# Patient Record
Sex: Female | Born: 1965 | Race: White | Hispanic: No | Marital: Married | Smoking: Never smoker
Health system: Southern US, Community
[De-identification: ages and names within clinical notes are randomized; demographics above are authoritative.]

## PROBLEM LIST (undated history)

## (undated) DIAGNOSIS — L0100 Impetigo, unspecified: Secondary | ICD-10-CM

## (undated) DIAGNOSIS — M25539 Pain in unspecified wrist: Secondary | ICD-10-CM

## (undated) DIAGNOSIS — Z9189 Other specified personal risk factors, not elsewhere classified: Secondary | ICD-10-CM

## (undated) DIAGNOSIS — Z87448 Personal history of other diseases of urinary system: Secondary | ICD-10-CM

## (undated) DIAGNOSIS — J309 Allergic rhinitis, unspecified: Secondary | ICD-10-CM

## (undated) DIAGNOSIS — R011 Cardiac murmur, unspecified: Secondary | ICD-10-CM

## (undated) DIAGNOSIS — R197 Diarrhea, unspecified: Secondary | ICD-10-CM

## (undated) HISTORY — DX: Impetigo, unspecified: L01.00

## (undated) HISTORY — DX: Diarrhea, unspecified: R19.7

## (undated) HISTORY — DX: Other specified personal risk factors, not elsewhere classified: Z91.89

## (undated) HISTORY — DX: Allergic rhinitis, unspecified: J30.9

## (undated) HISTORY — PX: OTHER SURGICAL HISTORY: SHX169

## (undated) HISTORY — DX: Personal history of other diseases of urinary system: Z87.448

## (undated) HISTORY — DX: Pain in unspecified wrist: M25.539

## (undated) HISTORY — DX: Cardiac murmur, unspecified: R01.1

---
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
## 2009-03-30 ENCOUNTER — Encounter (INDEPENDENT_AMBULATORY_CARE_PROVIDER_SITE_OTHER): Payer: Self-pay | Admitting: *Deleted

## 2009-03-30 ENCOUNTER — Encounter: Payer: Self-pay | Admitting: Internal Medicine

## 2009-03-30 ENCOUNTER — Ambulatory Visit: Payer: Self-pay | Admitting: Internal Medicine

## 2009-03-30 DIAGNOSIS — J309 Allergic rhinitis, unspecified: Secondary | ICD-10-CM | POA: Insufficient documentation

## 2009-03-30 DIAGNOSIS — Z9189 Other specified personal risk factors, not elsewhere classified: Secondary | ICD-10-CM | POA: Insufficient documentation

## 2009-03-30 DIAGNOSIS — R011 Cardiac murmur, unspecified: Secondary | ICD-10-CM

## 2009-03-30 DIAGNOSIS — Z87448 Personal history of other diseases of urinary system: Secondary | ICD-10-CM | POA: Insufficient documentation

## 2009-03-30 HISTORY — DX: Allergic rhinitis, unspecified: J30.9

## 2009-03-30 HISTORY — DX: Cardiac murmur, unspecified: R01.1

## 2009-03-30 HISTORY — DX: Other specified personal risk factors, not elsewhere classified: Z91.89

## 2009-03-30 HISTORY — DX: Personal history of other diseases of urinary system: Z87.448

## 2009-04-02 ENCOUNTER — Ambulatory Visit: Payer: Self-pay

## 2009-04-02 ENCOUNTER — Ambulatory Visit: Payer: Self-pay | Admitting: Internal Medicine

## 2009-04-02 ENCOUNTER — Ambulatory Visit (HOSPITAL_COMMUNITY): Admission: RE | Admit: 2009-04-02 | Discharge: 2009-04-02 | Payer: Self-pay | Admitting: Internal Medicine

## 2009-04-02 ENCOUNTER — Encounter: Payer: Self-pay | Admitting: Internal Medicine

## 2009-04-02 LAB — CONVERTED CEMR LAB
AST: 18 units/L (ref 0–37)
Albumin: 4.4 g/dL (ref 3.5–5.2)
Alkaline Phosphatase: 53 units/L (ref 39–117)
Basophils Relative: 0.7 % (ref 0.0–3.0)
Calcium: 10.3 mg/dL (ref 8.4–10.5)
Direct LDL: 132 mg/dL
Eosinophils Relative: 3.8 % (ref 0.0–5.0)
GFR calc non Af Amer: 72.83 mL/min (ref >60)
Hemoglobin: 13.9 g/dL (ref 12.0–15.0)
Lymphocytes Relative: 23.5 % (ref 12.0–46.0)
Monocytes Relative: 8.2 % (ref 3.0–12.0)
Neutro Abs: 3.9 10*3/uL (ref 1.4–7.7)
RBC: 5.32 M/uL — ABNORMAL HIGH (ref 3.87–5.11)
Total Protein: 7.6 g/dL (ref 6.0–8.3)
VLDL: 19.4 mg/dL (ref 0.0–40.0)
Vit D, 25-Hydroxy: 21 ng/mL — ABNORMAL LOW (ref 30–89)

## 2009-07-04 ENCOUNTER — Ambulatory Visit: Payer: Self-pay | Admitting: Internal Medicine

## 2009-07-04 DIAGNOSIS — L0100 Impetigo, unspecified: Secondary | ICD-10-CM

## 2009-07-04 HISTORY — DX: Impetigo, unspecified: L01.00

## 2009-07-08 ENCOUNTER — Encounter: Payer: Self-pay | Admitting: Internal Medicine

## 2009-07-09 ENCOUNTER — Telehealth: Payer: Self-pay | Admitting: Internal Medicine

## 2009-07-09 ENCOUNTER — Ambulatory Visit: Payer: Self-pay | Admitting: Internal Medicine

## 2009-07-09 DIAGNOSIS — R197 Diarrhea, unspecified: Secondary | ICD-10-CM

## 2009-07-09 HISTORY — DX: Diarrhea, unspecified: R19.7

## 2009-09-05 ENCOUNTER — Ambulatory Visit: Payer: Self-pay | Admitting: Internal Medicine

## 2009-09-05 DIAGNOSIS — M25539 Pain in unspecified wrist: Secondary | ICD-10-CM

## 2009-09-05 HISTORY — DX: Pain in unspecified wrist: M25.539

## 2010-07-23 NOTE — Progress Notes (Signed)
Summary: [facility]  Phone Note Other Incoming   Caller: [facility] Call Report Summary of Call: pt called 07/08/2009 @ 1:53pm stating that she has had diarrhea, cramping and gasiness since 07/05/2009. Pt has not fever and has tried Immodium x 24hrs with no relief. Pt was advised to call MD 07/09/2009 if not improved on "Diarrhea Care DIet". Report to scan Initial call taken by: Margaret Pyle, CMA,  July 09, 2009 8:50 AM  Follow-up for Phone Call        noted - agree with recs - thanks Follow-up by: Newt Lukes MD,  July 09, 2009 9:52 AM

## 2010-07-23 NOTE — Assessment & Plan Note (Signed)
Summary: rash, infection outside left knee/cd   Vital Signs:  Patient profile:   45 year old female Height:      67.5 inches (171.45 cm) Weight:      196.25 pounds (89.20 kg) O2 Sat:      97 % on Room air Temp:     97.9 degrees F (36.61 degrees C) oral Pulse rate:   64 / minute BP sitting:   146 / 80  (left arm) Cuff size:   large  Vitals Entered By: Josph Macho CMA (July 04, 2009 4:04 PM)  O2 Flow:  Room air CC: Rash, infection outside of left knee X4days-really itchy/ CF   Primary Care Provider:  Newt Lukes, MD  CC:  Rash and infection outside of left knee X4days-really itchy/ CF.  History of Present Illness: here today with complaint of rash, ?possible infection. onset of symptoms was 4 days ago. course has been sudden onset and now occurs in expanding pattern. problem precipitated by ?ingrown hair symptom characterized as red scaly and itchy area on lateral side of left knee problem associated with itching but not associated with pain, fever or other affected body areas. symptoms improved by nothing - washing and using Neosporin w/o change in redness. symptoms worsened with scrathing. no prior hx of same symptoms.   Current Medications (verified): 1)  None  Allergies (verified): 1)  ! Floxin  Past History:  Past Medical History: Last updated: 03/30/2009 Allergic rhinitis  Review of Systems       The patient complains of suspicious skin lesions.  The patient denies fever, chest pain, headaches, abdominal pain, abnormal bleeding, and enlarged lymph nodes.    Physical Exam  General:  alert, well-developed, well-nourished, and cooperative to examination.   nontoxic Lungs:  normal respiratory effort, no intercostal retractions or use of accessory muscles; normal breath sounds bilaterally - no crackles and no wheezes.    Heart:  normal rate, regular rhythm, soft syst murmur 2/6 best ausc at right sternal border - no rub. BLE without edema.  Skin:   nickle size bright red patch with scaling over top, no surrounding cellulitis, +raised edge around circumfrence of lesion - no active drainage - no blister or brusing   Impression & Recommendations:  Problem # 1:  IMPETIGO (ZOX-096)  ?began as ingrown hair per pt with rapid progression to current state will cover for MRSA - oral and topical - may also represent "ringworm" and may need to consider antifungal tx if not responding to clinda/mupirocin - pt educated on same and ddx  Orders: Prescription Created Electronically [phone])  Complete Medication List: 1)  Clindamycin Hcl 300 Mg Caps (Clindamycin hcl) .Marland Kitchen.. 1 by mouth every 6 hours x 7 days 2)  Bactroban 2 % Oint (Mupirocin) .... Apply to affecetd skin three times a day x 7 days, then as needed  Patient Instructions: 1)  antibiotics - oral and topical - as discussed - your prescriptions have been electronically submitted to your pharmacy. Please take as directed. Contact our office if you believe you're having problems with the medication(s).  2)  if not better or if worsening after  days of this treatment, call and we will change approach to treat for more ring worm but will treat 1st for probable bacterial infection (includes coverage for MRSA) 3)  keep affecetd skin covered, wash two times a day with warm soapy water as instructed - Prescriptions: BACTROBAN 2 % OINT (MUPIROCIN) apply to affecetd skin three times a day  x 7 days, then as needed  #1 x 0   Entered and Authorized by:   Newt Lukes MD   Signed by:   Newt Lukes MD on 07/04/2009   Method used:   Electronically to        [facility]* (retail)       [address] [place], [place]  [address]       Ph: [phone] or [phone]       Fax: [phone]   RxID:   [id number] CLINDAMYCIN HCL 300 MG CAPS (CLINDAMYCIN HCL) 1 by mouth every 6 hours x 7 days  #28 x 0   Entered and Authorized by:   Newt Lukes MD   Signed by:   Newt Lukes MD on 07/04/2009   Method used:   Electronically to        [facility]* (retail)       [address] [place], [place]  [address]       Ph: [phone] or [phone]       Fax: [phone]   RxID:   [id number]

## 2010-07-23 NOTE — Letter (Signed)
Summary: Diarrhea & change in bowel habits/[facility]  Diarrhea & change in bowel habits/[facility]   Imported By: Sherian Rein 07/09/2009 15:00:47  _____________________________________________________________________  External Attachment:    Type:   Image     Comment:   External Document

## 2010-07-23 NOTE — Assessment & Plan Note (Signed)
Summary: RIGHT WRIST PAIN/MIGHT BE BROKEN/PN   Vital Signs:  Patient profile:   45 year old female Height:      67.5 inches (171.45 cm) Weight:      196.4 pounds (89.27 kg) O2 Sat:      97 % on Room air Temp:     98.5 degrees F (36.94 degrees C) oral Pulse rate:   66 / minute BP sitting:   128 / 78  (left arm) Cuff size:   large  Vitals Entered By: Orlan Leavens (September 05, 2009 4:20 PM)  O2 Flow:  Room air CC: (R) wrist pain . Pt states she slipped and fell this am landed om wrist Is Patient Diabetic? No Pain Assessment Patient in pain? yes     Location: (R) wrist Type: aching   Primary Care Provider:  Newt Lukes, MD  CC:  (R) wrist pain . Pt states she slipped and fell this am landed om wrist.  History of Present Illness:  Injury      This is a 45 year old woman who presents with An injury.  The problem began 8-12 hrs ago.  On a scale of 1 to 10, the intensity is described as a 6, mod-severe. pain most notable extention of thumb. pain improved with 600mg  ibuprofen x 1 dose.  pain caused by fall with accidental slip on BR floor this AM - landed on outstretched right hand.  The patient reports injury to the right wrist, but denies injury to the neck, right elbow, chest, and back.  The patient also reports swelling and tenderness.  The patient denies redness, blood loss, numbness, weakness, loss of sensation, and loss of consciousness.  The patient denies the following risk factors for significant bleeding: aspirin use and anticoagulant use.  Screening for risk of abuse was negative.    Current Medications (verified): 1)  Bactroban 2 % Oint (Mupirocin) .... Apply To Affecetd Skin Three Times A Day X 7 Days, Then As Needed 2)  Florastor 250 Mg Caps (Saccharomyces Boulardii) .... Take 1 By Mouth Qd  Allergies (verified): 1)  ! Floxin 2)  Clindamycin Hcl (Clindamycin Hcl)  Past History:  Past Medical History: Allergic rhinitis  Review of Systems  The patient denies  chest pain, syncope, headaches, and abdominal pain.    Physical Exam  General:  alert, well-developed, well-nourished, and cooperative to examination.   nontoxic Lungs:  normal respiratory effort, no intercostal retractions or use of accessory muscles; normal breath sounds bilaterally - no crackles and no wheezes.    Heart:  normal rate, regular rhythm, no murmur, and no rub. BLE without edema. Msk:  mild-mod swelling right wrist diffuse as compared to left - min bruising on flexor surface - no erythema or gross deformity - tender to palp over flexor side of wrist, slightly to radial side of midline but nontender over anatomic snuff box - active FROM with pain on wrist extention>flexion, esp thumb extention - digits 2-5 with intact nonpainful distal flex/ext -neurovasc intact distally   Impression & Recommendations:  Problem # 1:  WRIST PAIN, RIGHT (ICD-719.43) concern for fx vs. bad sprain -- check xray now - until results know, place in wrist splint, ice and pain control with ibuprofen as ongoing -  consider need for ortho referral depending on xray and symptoms  Orders: Splints- All Types (A4570) T-Wrist Comp Right (73110TC)  Complete Medication List: 1)  Bactroban 2 % Oint (Mupirocin) .... Apply to affecetd skin three times a day  x 7 days, then as needed 2)  Florastor 250 Mg Caps (Saccharomyces boulardii) .... Take 1 by mouth qd 3)  Ibuprofen 600 Mg Tabs (Ibuprofen) .... Take three times a day as needed for pain  Patient Instructions: 1)  it was good to see you today. 2)  xray ordered today - your results will be called to you in AM 3)  until then, wear wrist splint, ice and use ibuprofen 600mg  total every 6-8h as needed for pain -- 4)  further instructions to follow in AM after xray back.Marland KitchenMarland Kitchen

## 2010-07-23 NOTE — Assessment & Plan Note (Signed)
Summary: diarrhea x3 today/see phone note/cd   Vital Signs:  Patient profile:   45 year old female Height:      67.5 inches (171.45 cm) Weight:      194.4 pounds (88.36 kg) O2 Sat:      97 % on Room air Temp:     98.1 degrees F (36.72 degrees C) oral Pulse rate:   60 / minute BP sitting:   122 / 72  (left arm) Cuff size:   large  Vitals Entered By: Orlan Leavens (July 09, 2009 11:47 AM)  O2 Flow:  Room air CC: diarrhea x's 3 days. pt states 24 hours after starting the antibiotic (clindamycin) started having diarrhea Is Patient Diabetic? No Pain Assessment Patient in pain? no        Primary Care Provider:  Newt Lukes, MD  CC:  diarrhea x's 3 days. pt states 24 hours after starting the antibiotic (clindamycin) started having diarrhea.  History of Present Illness: here today with complaint of diarrhea. onset of symptoms was 5 days ago. course has been sudden onset and now occurs in intermittent but daily pattern (3-6x/day). problem precipitated by use of clindamycin (onset with 24h of 1st dose) symptom characterized as explosive liquid bowel movements - no bloody stool or mucus problem associated with gas and bloating but not associated with fever or abd pain. symptoms improved by nothing - tried [employer] as per call a nurse recs this weekend.. no prior hx of same symptoms.  no travel or under-cooked food exposures  skin lesion on leg is imrpoved since last OV when clinda was begun - still using bactroban oint w/o problems  Current Medications (verified): 1)  Clindamycin Hcl 300 Mg Caps (Clindamycin Hcl) .Marland Kitchen.. 1 By Mouth Every 6 Hours X 7 Days 2)  Bactroban 2 % Oint (Mupirocin) .... Apply To Affecetd Skin Three Times A Day X 7 Days, Then As Needed  Allergies (verified): 1)  ! Floxin 2)  Clindamycin Hcl (Clindamycin Hcl)  Past History:  Past Medical History: Last updated: 03/30/2009 Allergic rhinitis  Review of Systems  The patient denies fever, chest  pain, headaches, abdominal pain, melena, and hematochezia.    Physical Exam  General:  alert, well-developed, well-nourished, and cooperative to examination.   nontoxic Lungs:  normal respiratory effort, no intercostal retractions or use of accessory muscles; normal breath sounds bilaterally - no crackles and no wheezes.    Heart:  normal rate, regular rhythm, soft syst murmur 2/6 best ausc at right sternal border - no rub. BLE without edema.  Abdomen:  soft, non-tender, normal bowel sounds, no distention; no masses and no appreciable hepatomegaly or splenomegaly.   Skin:  same nickle size dulling pink patch with resolution of scaling , no surrounding cellulitis,  reduction of raised edge around circumfrence of lesion - no active drainage - no blister or brusing   Impression & Recommendations:  Problem # 1:  DIARRHEA (ICD-787.91)  likely abx induced - stop clinda - no evidence of superinfx as no fever, no blood, no abd/rectal pain rec Align supplemention x 7 d, then as needed  ok to cont BRAT diet  Discussed symptom control and diet. Call if worsening of symptoms or signs of dehydration.   Her updated medication list for this problem includes:    Align Caps (Probiotic product) .Marland Kitchen... 1 by mouth once daily x 7days, then as needed  Problem # 2:  IMPETIGO (ICD-684) improving -  stop systemic abx, cont topical ointment  Complete Medication List: 1)  Bactroban 2 % Oint (Mupirocin) .... Apply to affecetd skin three times a day x 7 days, then as needed 2)  Align Caps (Probiotic product) .Marland Kitchen.. 1 by mouth once daily x 7days, then as needed  Patient Instructions: 1)  it was good to see you today.  2)  stop clindamycin (added to your drug intolerance list) 3)  add Align once daily x 7days as probiotic to help your GI tract recover - 4)  if fever, worsening abdominal pain or any increase in problems with your affected skin, call for further evaluation and treatment - 5)  continue antibiotic  cream only on skin for now

## 2010-12-17 ENCOUNTER — Encounter: Payer: Self-pay | Admitting: Internal Medicine

## 2012-05-08 ENCOUNTER — Emergency Department
Admission: EM | Admit: 2012-05-08 | Discharge: 2012-05-08 | Disposition: A | Discharge status: Home / Self Care | Payer: BC Managed Care – PPO | Source: Home / Self Care

## 2012-05-08 DIAGNOSIS — W5501XA Bitten by cat, initial encounter: Secondary | ICD-10-CM

## 2012-05-08 DIAGNOSIS — T148XXA Other injury of unspecified body region, initial encounter: Secondary | ICD-10-CM

## 2012-05-08 DIAGNOSIS — Z23 Encounter for immunization: Secondary | ICD-10-CM

## 2012-05-08 DIAGNOSIS — IMO0001 Reserved for inherently not codable concepts without codable children: Secondary | ICD-10-CM

## 2012-05-08 MED ORDER — TETANUS-DIPHTH-ACELL PERTUSSIS 5-2.5-18.5 LF-MCG/0.5 IM SUSP
0.5000 mL | Freq: Once | INTRAMUSCULAR | Status: AC
  Administered 2012-05-08: 0.5 mL via INTRAMUSCULAR

## 2012-05-08 MED ORDER — DOXYCYCLINE HYCLATE 100 MG PO CAPS: 100.0000 mg | ORAL_CAPSULE | Freq: Two times a day (BID) | ORAL | Status: DC

## 2012-05-08 NOTE — ED Notes (Signed)
Pt bitten by her cat last night

## 2012-05-08 NOTE — ED Provider Notes (Signed)
History     CSN: [id number]  Arrival date & time 05/08/12  1118   None     Chief Complaint  Patient presents with  . Animal Bite    (Consider location/radiation/quality/duration/timing/severity/associated sxs/prior treatment) HPI She was playing cat sounds on her phone and her cat attacked her arm.  She reports that the wounds or bites and scratches.  She went in immediately and cleaned it off with soap and water and peroxide.  It has been oozing a little bit but has stopped bleeding.  This happened yesterday.  She states that her tetanus shot is needing to be updated.  No fever chills.  The wounds are located on her right arm and are slightly sore.  Past Medical History  Diagnosis Date  . ALLERGIC RHINITIS 03/30/2009  . CARDIAC MURMUR 03/30/2009  . CHICKENPOX, HX OF 03/30/2009  . Diarrhea 07/09/2009  . Impetigo 07/04/2009  . UTI'S, HX OF 03/30/2009  . WRIST PAIN, RIGHT 09/05/2009    Past Surgical History  Procedure Date  . Removal of pilonical cyst 1987  . Right ankle surgery     x's 2 1989 & 1990  . Cesarean section 2003    Family History  Problem Relation Age of Onset  . Prostate cancer Father     dx 2005  . Lung cancer Maternal Aunt   . Diabetes Maternal Aunt   . Depression Maternal Grandfather     After mandatory retirement    History  Substance Use Topics  . Smoking status: Never Smoker   . Smokeless tobacco: Not on file     Comment: Married, [place] with spouse and 2 kids. Employed-contract empl for journalism & marketing  . Alcohol Use: Yes     Comment: occassional    OB History    Grav Para Term Preterm Abortions TAB SAB Ect Mult Living                  Review of Systems  All other systems reviewed and are negative.    Allergies  Clindamycin hcl and Ofloxacin  Home Medications   Current Outpatient Rx  Name  Route  Sig  Dispense  Refill  . DOXYCYCLINE HYCLATE 100 MG PO CAPS   Oral   Take 1 capsule (100 mg total) by mouth 2 (two) times  daily.   16 capsule   0   . IBUPROFEN 600 MG PO TABS   Oral   Take 600 mg by mouth 3 (three) times daily as needed.           Marland Kitchen MUPIROCIN 2 % EX OINT   Topical   Apply topically as needed.           Marland Kitchen SACCHAROMYCES BOULARDII 250 MG PO CAPS   Oral   Take 250 mg by mouth daily.             BP 144/86  Pulse 63  Temp 97.8 F (36.6 C) (Oral)  Resp 18  Ht 5\' 7"  (1.702 m)  Wt 207 lb (93.895 kg)  BMI 32.42 kg/m2  SpO2 96%  LMP 05/02/2012  Physical Exam  Nursing note and vitals reviewed. Constitutional: She is oriented to person, place, and time. She appears well-developed and well-nourished.  HENT:  Head: Normocephalic and atraumatic.  Eyes: No scleral icterus.  Neck: Neck supple.  Cardiovascular: Regular rhythm and normal heart sounds.   Pulmonary/Chest: Effort normal and breath sounds normal. No respiratory distress.  Neurological: She is alert and oriented  to person, place, and time.  Skin: Skin is warm and dry.          Multiple linear cat wounds as drawn on the diagram.  There is minimal redness around it but no signs of cellulitis or drainage or other bacterial infection at this point.  Mildly tender to palpation.  Distal neurovascular status is intact.  Psychiatric: She has a normal mood and affect. Her speech is normal.    ED Course  Procedures (including critical care time)  Labs Reviewed - No data to display No results found.   1. Cat bite       MDM   Since it has been more than 5 years, we gave her a tetanus shot today.  I also gave her a prescription for doxycycline to cover any kind of him infection that may cause.  I did give her the option of holding on for a couple days but she would prefer to start the antibiotic today.  I think that is reasonable.  Regular wound precautions are given and followup as needed.  Marlaine Hind, MD 05/08/12 [phone]

## 2012-08-07 ENCOUNTER — Other Ambulatory Visit: Payer: Self-pay

## 2013-04-04 ENCOUNTER — Emergency Department (INDEPENDENT_AMBULATORY_CARE_PROVIDER_SITE_OTHER): Payer: BC Managed Care – PPO

## 2013-04-04 ENCOUNTER — Emergency Department (INDEPENDENT_AMBULATORY_CARE_PROVIDER_SITE_OTHER)
Admission: EM | Admit: 2013-04-04 | Discharge: 2013-04-04 | Disposition: A | Discharge status: Home / Self Care | Payer: BC Managed Care – PPO | Source: Home / Self Care | Attending: Family Medicine | Admitting: Family Medicine

## 2013-04-04 ENCOUNTER — Encounter: Payer: Self-pay | Admitting: Emergency Medicine

## 2013-04-04 DIAGNOSIS — S93609A Unspecified sprain of unspecified foot, initial encounter: Secondary | ICD-10-CM

## 2013-04-04 DIAGNOSIS — S93602A Unspecified sprain of left foot, initial encounter: Secondary | ICD-10-CM

## 2013-04-04 DIAGNOSIS — M25579 Pain in unspecified ankle and joints of unspecified foot: Secondary | ICD-10-CM

## 2013-04-04 NOTE — ED Notes (Signed)
Pt c/o LT foot and ankle pain x this morning, post fall at home. She reports taking advil 200mg  ii PO at 1600 today.

## 2013-04-05 NOTE — ED Provider Notes (Signed)
CSN: [id number]     Arrival date & time 04/04/13  2012 History   First MD Initiated Contact with Patient 04/04/13 2051     Chief Complaint  Patient presents with  . Foot Injury     HPI Comments: Patient twisted her right foot/ankle while stepping off the bottom two steps of stairs this morning.  She has had persistent pain over dorsal/lateral area of left foot/ankle.  Patient is a 47 y.o. female presenting with foot injury. The history is provided by the patient.  Foot Injury Location:  Foot Time since incident:  12 hours Injury: yes   Mechanism of injury comment:  Twisted on stairs Foot location:  L foot Pain details:    Quality:  Aching   Radiates to:  Does not radiate   Severity:  Moderate   Onset quality:  Sudden   Duration:  12 hours   Timing:  Constant   Progression:  Unchanged Chronicity:  New Prior injury to area:  No Relieved by:  Nothing Worsened by:  Bearing weight Ineffective treatments:  NSAIDs Associated symptoms: decreased ROM, stiffness and swelling   Associated symptoms: no back pain, no muscle weakness, no numbness and no tingling   Risk factors: obesity     Past Medical History  Diagnosis Date  . ALLERGIC RHINITIS 03/30/2009  . CARDIAC MURMUR 03/30/2009  . CHICKENPOX, HX OF 03/30/2009  . Diarrhea 07/09/2009  . Impetigo 07/04/2009  . UTI'S, HX OF 03/30/2009  . WRIST PAIN, RIGHT 09/05/2009   Past Surgical History  Procedure Laterality Date  . Removal of pilonical cyst  1987  . Right ankle surgery      x's 2 1989 & 1990  . Cesarean section  2003   Family History  Problem Relation Age of Onset  . Prostate cancer Father     dx 2005  . Crohn's disease Father   . Ankylosing spondylitis Father   . Lung cancer Maternal Aunt   . Diabetes Maternal Aunt   . Depression Maternal Grandfather     After mandatory retirement   History  Substance Use Topics  . Smoking status: Never Smoker   . Smokeless tobacco: Not on file     Comment: Mar[place], [place]  with spouse and 2 kids. Employed-contract empl for journalism & marketing  . Alcohol Use: Yes     Comment: occassional   OB History   Grav Para Term Preterm Abortions TAB SAB Ect Mult Living                 Review of Systems  Musculoskeletal: Positive for stiffness. Negative for back pain.  All other systems reviewed and are negative.    Allergies  Clindamycin hcl and Ofloxacin  Home Medications   Current Outpatient Rx  Name  Route  Sig  Dispense  Refill  . doxycycline (VIBRAMYCIN) 100 MG capsule   Oral   Take 1 capsule (100 mg total) by mouth 2 (two) times daily.   16 capsule   0   . ibuprofen (ADVIL,MOTRIN) 600 MG tablet   Oral   Take 600 mg by mouth 3 (three) times daily as needed.           . mupirocin (BACTROBAN) 2 % ointment   Topical   Apply topically as needed.           . saccharomyces boulardii (FLORASTOR) 250 MG capsule   Oral   Take 250 mg by mouth daily.  BP 135/85  Pulse 76  Temp(Src) 98.3 F (36.8 C) (Oral)  Resp 18  Ht 5\' 8"  (1.727 m)  Wt 215 lb (97.523 kg)  BMI 32.7 kg/m2  SpO2 97%  LMP 04/03/2013 Physical Exam  Nursing note and vitals reviewed. Constitutional: She is oriented to person, place, and time. She appears well-developed and well-nourished. No distress.  Patient is obese (BMI 32.7)  HENT:  Head: Atraumatic.  Eyes: Conjunctivae are normal. Pupils are equal, round, and reactive to light.  Musculoskeletal:       Left ankle: She exhibits normal range of motion, no swelling, no ecchymosis, no deformity, no laceration and normal pulse. Tenderness. AITFL tenderness found. No lateral malleolus, no medial malleolus, no CF ligament, no posterior TFL, no head of 5th metatarsal and no proximal fibula tenderness found.       Left foot: She exhibits tenderness and bony tenderness. She exhibits normal range of motion, no swelling, normal capillary refill, no crepitus, no deformity and no laceration.       Feet:  Left foot has  tenderness to palpation dorsal/laterally over tarsals.  Tenderness extends to just anterior to the lateral malleolus.  Distal neurovascular function is intact.   Neurological: She is alert and oriented to person, place, and time.  Skin: Skin is warm and dry.    ED Course  Procedures  none    Imaging Review Dg Ankle Complete Left  04/04/2013   CLINICAL DATA:  Left ankle/foot pain, inversion injury  EXAM: LEFT ANKLE COMPLETE - 3+ VIEW  COMPARISON:  None.  FINDINGS: No fracture or dislocation is seen.  The ankle mortise is intact.  The base of the fifth metatarsal is unremarkable.  Small plantar calcaneal enthesophyte.  Mild medial soft tissue swelling.  IMPRESSION: No fracture or dislocation is seen.   Electronically Signed   By: Charline Bills M.D.   On: 04/04/2013 20:50   Dg Foot Complete Left  04/04/2013   CLINICAL DATA:  The ankle and foot pain after trauma  EXAM: LEFT FOOT - COMPLETE 3+ VIEW  COMPARISON:  None.  FINDINGS: There is no evidence of fracture or dislocation. There is no evidence of arthropathy or other focal bone abnormality. Soft tissues are unremarkable. Incidental plantar calcaneal enthesophyte.  IMPRESSION: Negative for acute osseous injury.   Electronically Signed   By: Tiburcio Pea M.D.   On: 04/04/2013 21:16      MDM   1. Sprain of foot, left, initial encounter    Ace wrap applied Wear ace wrap daytime.  Apply ice pack for 30 to 45 minutes every 1 to 4 hours.  Continue until swelling decreases.  May take Ibuprofen 200mg , 4 tabs every 8 hours with food.  Begin range of motion exercises in about 5 days ([facility] information and instruction handout given)  Followup with [facility] if not improving about two weeks.     Lattie Haw, MD 04/06/13 [phone]

## 2013-04-28 ENCOUNTER — Other Ambulatory Visit: Payer: Self-pay

## 2015-03-24 ENCOUNTER — Telehealth: Payer: Self-pay | Admitting: Family

## 2015-03-24 DIAGNOSIS — J0120 Acute ethmoidal sinusitis, unspecified: Secondary | ICD-10-CM

## 2015-03-24 MED ORDER — AMOXICILLIN-POT CLAVULANATE 875-125 MG PO TABS: 1.0000 | ORAL_TABLET | Freq: Two times a day (BID) | ORAL | Status: AC

## 2015-03-24 NOTE — Progress Notes (Signed)

## 2015-06-09 IMAGING — CR DG FOOT COMPLETE 3+V*L*
3 series · 3 of 3 positions shown · non-contrast
Comparison: None.

CLINICAL DATA: The ankle and foot pain after trauma

EXAM:
LEFT FOOT - COMPLETE 3+ VIEW

[view not recorded (1 of 3)]
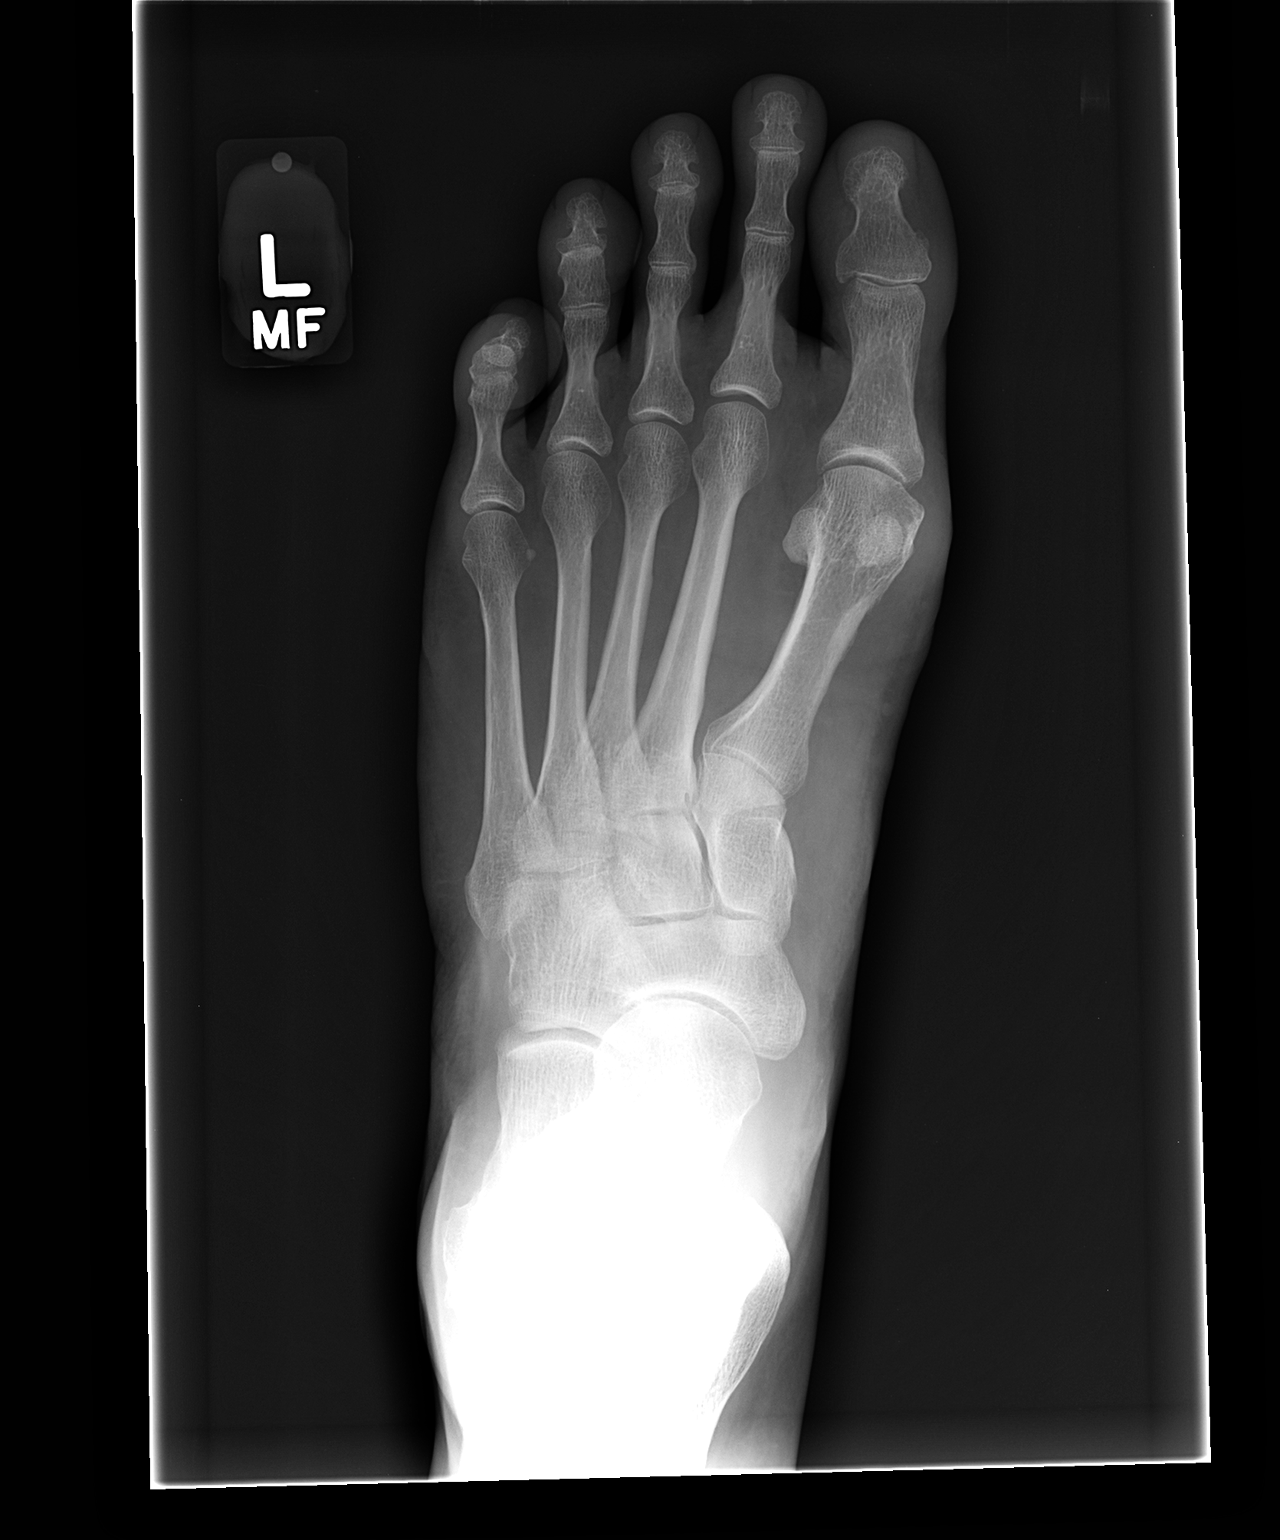

[view not recorded (2 of 3)]
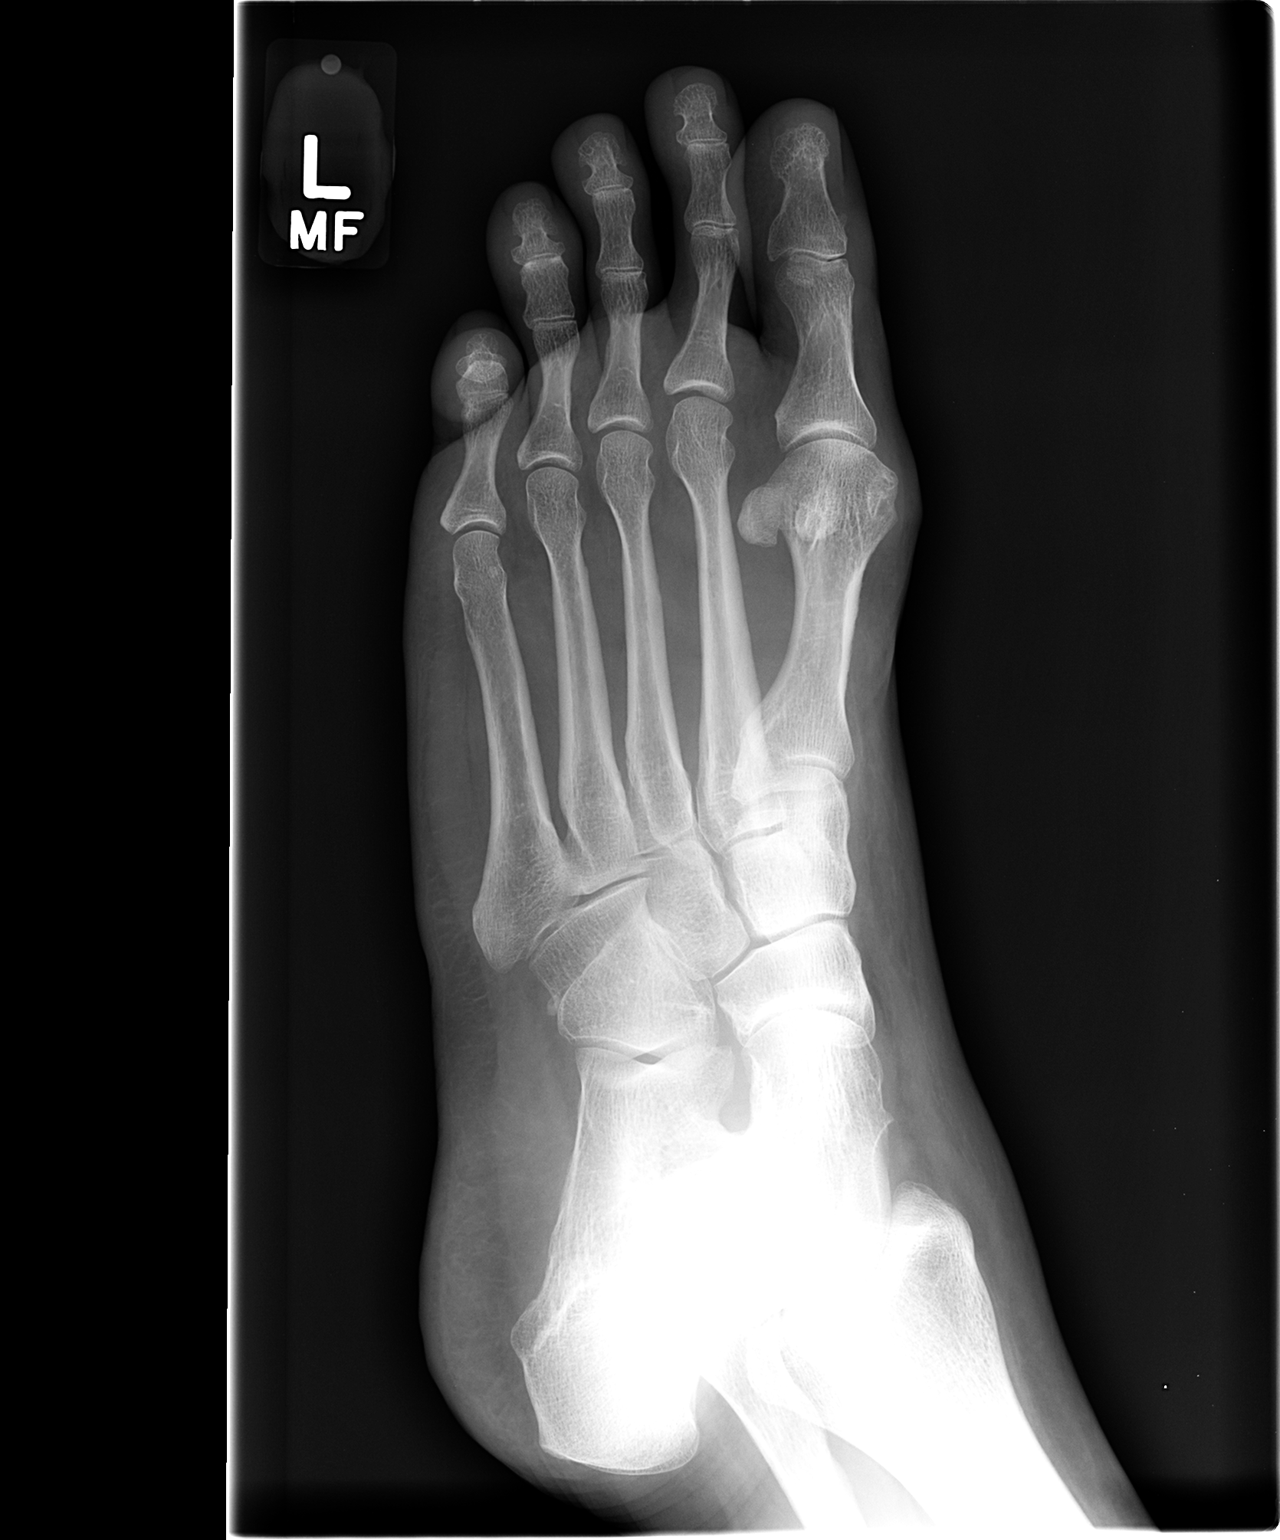

[view not recorded (3 of 3)]
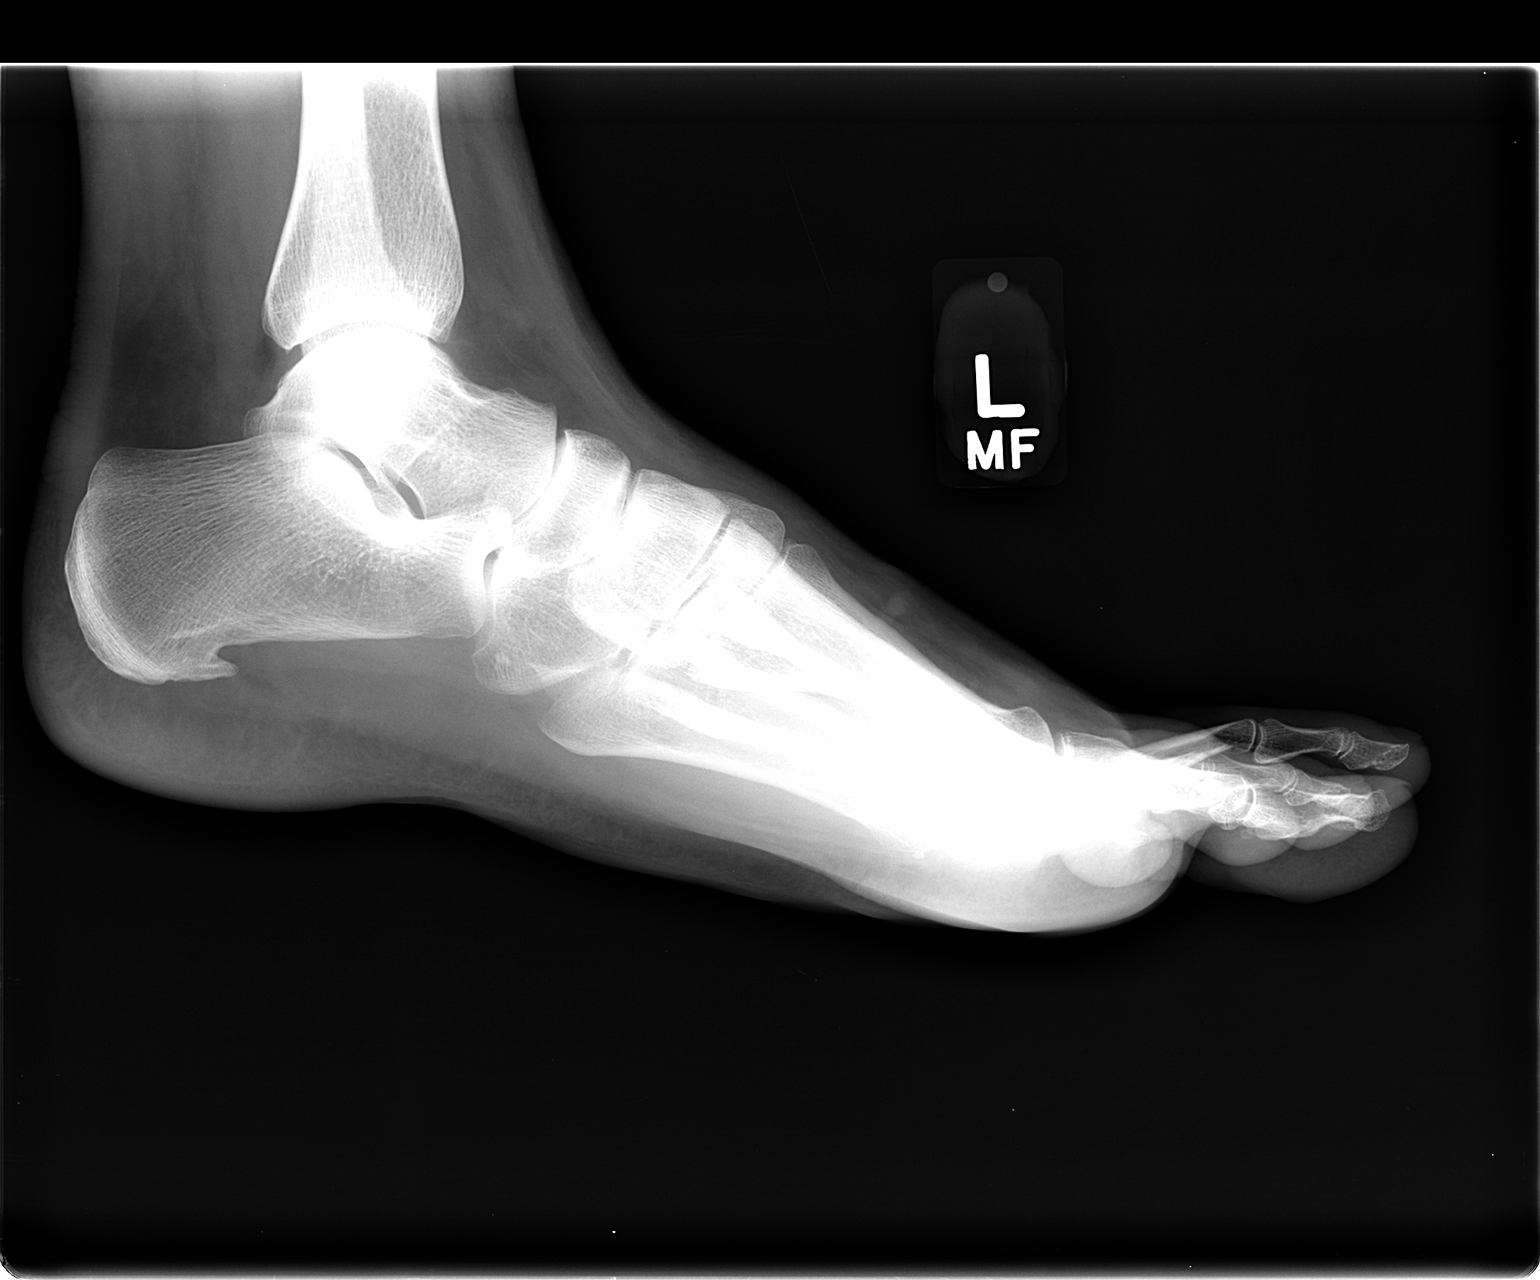

[3 of 3 positions shown; findings below may reference images not displayed]

FINDINGS: There is no evidence of fracture or dislocation. There is no
evidence of arthropathy or other focal bone abnormality. Soft
tissues are unremarkable. Incidental plantar calcaneal enthesophyte.
IMPRESSION: Negative for acute osseous injury.

## 2017-06-30 ENCOUNTER — Telehealth: Payer: Self-pay | Admitting: Family

## 2017-06-30 DIAGNOSIS — J019 Acute sinusitis, unspecified: Secondary | ICD-10-CM

## 2017-06-30 DIAGNOSIS — B9689 Other specified bacterial agents as the cause of diseases classified elsewhere: Secondary | ICD-10-CM

## 2017-06-30 MED ORDER — AMOXICILLIN-POT CLAVULANATE 875-125 MG PO TABS: 1.0000 | ORAL_TABLET | Freq: Two times a day (BID) | ORAL | 0 refills | Status: DC

## 2017-06-30 NOTE — Progress Notes (Signed)

## 2018-05-07 ENCOUNTER — Telehealth: Payer: Self-pay | Admitting: Family

## 2018-05-07 DIAGNOSIS — J028 Acute pharyngitis due to other specified organisms: Secondary | ICD-10-CM

## 2018-05-07 DIAGNOSIS — B9689 Other specified bacterial agents as the cause of diseases classified elsewhere: Secondary | ICD-10-CM

## 2018-05-07 MED ORDER — BENZONATATE 100 MG PO CAPS: 100.0000 mg | ORAL_CAPSULE | Freq: Three times a day (TID) | ORAL | 0 refills | Status: DC | PRN

## 2018-05-07 MED ORDER — AZITHROMYCIN 250 MG PO TABS: ORAL_TABLET | ORAL | 0 refills | Status: DC

## 2018-05-07 MED ORDER — PREDNISONE 5 MG PO TABS: 5.0000 mg | ORAL_TABLET | ORAL | 0 refills | Status: DC

## 2018-05-07 NOTE — Progress Notes (Signed)

## 2022-11-08 ENCOUNTER — Telehealth: Payer: Self-pay | Admitting: Nurse Practitioner

## 2022-11-08 DIAGNOSIS — J0100 Acute maxillary sinusitis, unspecified: Secondary | ICD-10-CM

## 2022-11-08 MED ORDER — AMOXICILLIN-POT CLAVULANATE 875-125 MG PO TABS: 1.0000 | ORAL_TABLET | Freq: Two times a day (BID) | ORAL | 0 refills | Status: DC

## 2022-11-08 NOTE — Progress Notes (Signed)

## 2023-08-01 ENCOUNTER — Telehealth: Payer: 59 | Admitting: Family

## 2023-08-01 DIAGNOSIS — J019 Acute sinusitis, unspecified: Secondary | ICD-10-CM

## 2023-08-02 MED ORDER — AMOXICILLIN-POT CLAVULANATE 875-125 MG PO TABS: 1.0000 | ORAL_TABLET | Freq: Two times a day (BID) | ORAL | 0 refills | Status: DC

## 2023-08-02 NOTE — Progress Notes (Signed)

## 2024-02-09 ENCOUNTER — Ambulatory Visit (INDEPENDENT_AMBULATORY_CARE_PROVIDER_SITE_OTHER): Admitting: Internal Medicine

## 2024-02-09 VITALS — BP 130/84 | HR 59 | Temp 98.4°F | Ht 66.5 in | Wt 194.6 lb

## 2024-02-09 DIAGNOSIS — Z124 Encounter for screening for malignant neoplasm of cervix: Secondary | ICD-10-CM | POA: Diagnosis not present

## 2024-02-09 DIAGNOSIS — Z1231 Encounter for screening mammogram for malignant neoplasm of breast: Secondary | ICD-10-CM

## 2024-02-09 DIAGNOSIS — Z114 Encounter for screening for human immunodeficiency virus [HIV]: Secondary | ICD-10-CM

## 2024-02-09 DIAGNOSIS — Z1159 Encounter for screening for other viral diseases: Secondary | ICD-10-CM

## 2024-02-09 DIAGNOSIS — Z0000 Encounter for general adult medical examination without abnormal findings: Secondary | ICD-10-CM

## 2024-02-09 DIAGNOSIS — Z1211 Encounter for screening for malignant neoplasm of colon: Secondary | ICD-10-CM

## 2024-02-09 NOTE — Progress Notes (Signed)
 Established Patient Office Visit     CC/Reason for Visit: Establish care, annual preventive exam  HPI: Kaitlyn Wells is a 58 y.o. female who is coming in today for the above mentioned reasons.  No past medical history of significance, feeling well without acute concerns or complaints.  Is overdue for all age-appropriate cancer screening, is due for Tdap, shingles and pneumonia vaccines.  Family history significant for father with Crohn's disease, prostate cancer and ankylosing spondylitis.   Past Medical/Surgical History: Past Medical History:  Diagnosis Date   ALLERGIC RHINITIS 03/30/2009   CARDIAC MURMUR 03/30/2009   CHICKENPOX, HX OF 03/30/2009   Diarrhea 07/09/2009   Impetigo 07/04/2009   UTI'S, HX OF 03/30/2009   WRIST PAIN, RIGHT 09/05/2009    Past Surgical History:  Procedure Laterality Date   CESAREAN SECTION  2003   Removal of Pilonical cyst  1987   Right ankle surgery     x's 2 1989 & 1990    Social History:  reports that she has never smoked. She does not have any smokeless tobacco history on file. She reports current alcohol use. She reports that she does not use drugs.  Allergies: Allergies  Allergen Reactions   Clindamycin Hcl     REACTION: diarrhea   Ofloxacin     Family History:  Family History  Problem Relation Age of Onset   Prostate cancer Father        dx 2005   Crohn's disease Father    Ankylosing spondylitis Father    Lung cancer Maternal Aunt    Diabetes Maternal Aunt    Depression Maternal Grandfather        After mandatory retirement    No current outpatient medications on file.  Review of Systems:  Negative unless indicated in HPI.   Physical Exam: Vitals:   02/09/24 1301  BP: 130/84  Pulse: (!) 59  Temp: 98.4 F (36.9 C)  TempSrc: Oral  SpO2: 97%  Weight: 194 lb 9.6 oz (88.3 kg)  Height: 5' 6.5 (1.689 m)    Body mass index is 30.94 kg/m.   Physical Exam Vitals reviewed.  Constitutional:      General: She is  not in acute distress.    Appearance: Normal appearance. She is not ill-appearing, toxic-appearing or diaphoretic.  HENT:     Head: Normocephalic.     Right Ear: Tympanic membrane, ear canal and external ear normal. There is no impacted cerumen.     Left Ear: Tympanic membrane, ear canal and external ear normal. There is no impacted cerumen.     Nose: Nose normal.     Mouth/Throat:     Mouth: Mucous membranes are moist.     Pharynx: Oropharynx is clear. No oropharyngeal exudate or posterior oropharyngeal erythema.  Eyes:     General: No scleral icterus.       Right eye: No discharge.        Left eye: No discharge.     Conjunctiva/sclera: Conjunctivae normal.     Pupils: Pupils are equal, round, and reactive to light.  Neck:     Vascular: No carotid bruit.  Cardiovascular:     Rate and Rhythm: Normal rate and regular rhythm.     Pulses: Normal pulses.     Heart sounds: Normal heart sounds.  Pulmonary:     Effort: Pulmonary effort is normal. No respiratory distress.     Breath sounds: Normal breath sounds.  Abdominal:     General: Abdomen is  flat. Bowel sounds are normal.     Palpations: Abdomen is soft.  Musculoskeletal:        General: Normal range of motion.     Cervical back: Normal range of motion.  Skin:    General: Skin is warm and dry.  Neurological:     General: No focal deficit present.     Mental Status: She is alert and oriented to person, place, and time. Mental status is at baseline.  Psychiatric:        Mood and Affect: Mood normal.        Behavior: Behavior normal.        Thought Content: Thought content normal.        Judgment: Judgment normal.       Impression and Plan:  Encounter for preventive health examination -     CBC with Differential/Platelet; Future -     Comprehensive metabolic panel with GFR; Future -     Hemoglobin A1c; Future -     Lipid panel; Future -     TSH; Future -     Vitamin B12; Future -     VITAMIN D 25 Hydroxy (Vit-D  Deficiency, Fractures); Future  Encounter for screening mammogram for malignant neoplasm of breast -     Digital Screening Mammogram, Left and Right; Future  Screening for malignant neoplasm of colon -     Ambulatory referral to Gastroenterology  Screening for cervical cancer -     Ambulatory referral to Gynecology  Encounter for hepatitis C screening test for low risk patient -     Hepatitis C antibody; Future  Encounter for screening for HIV -     HIV Antibody (routine testing w rflx); Future   -Recommend routine eye and dental care. -Healthy lifestyle discussed in detail. -Labs to be updated today. -Prostate cancer screening: N/A Health Maintenance  Topic Date Due   HIV Screening  Never done   Hepatitis C Screening  Never done   Hepatitis B Vaccine (1 of 3 - 19+ 3-dose series) Never done   Pap with HPV screening  Never done   Colon Cancer Screening  Never done   Pneumococcal Vaccine for age over 16 (1 of 1 - PCV) Never done   Mammogram  Never done   Zoster (Shingles) Vaccine (1 of 2) Never done   DTaP/Tdap/Td vaccine (3 - Td or Tdap) 05/08/2022   COVID-19 Vaccine (1 - 2024-25 season) Never done   Flu Shot  01/22/2024   HPV Vaccine  Aged Out   Meningitis B Vaccine  Aged Out     - Refer to also been placed for appropriate cancer screening. - She declines vaccines today.    Tully Theophilus Andrews, MD [facility]

## 2024-03-23 ENCOUNTER — Encounter: Payer: Self-pay | Admitting: Internal Medicine
